# Patient Record
Sex: Female | Born: 1995
Health system: Southern US, Community
[De-identification: ages and names within clinical notes are randomized; demographics above are authoritative.]

## PROBLEM LIST (undated history)

## (undated) DIAGNOSIS — K219 Gastro-esophageal reflux disease without esophagitis: Secondary | ICD-10-CM

---
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
## 2015-05-17 ENCOUNTER — Encounter (HOSPITAL_BASED_OUTPATIENT_CLINIC_OR_DEPARTMENT_OTHER): Payer: Self-pay | Admitting: *Deleted

## 2015-05-17 ENCOUNTER — Emergency Department (HOSPITAL_BASED_OUTPATIENT_CLINIC_OR_DEPARTMENT_OTHER)
Admission: EM | Admit: 2015-05-17 | Discharge: 2015-05-17 | Disposition: A | Discharge status: Home / Self Care | Payer: Self-pay | Attending: Emergency Medicine | Admitting: Emergency Medicine

## 2015-05-17 DIAGNOSIS — Z792 Long term (current) use of antibiotics: Secondary | ICD-10-CM | POA: Insufficient documentation

## 2015-05-17 DIAGNOSIS — Z79899 Other long term (current) drug therapy: Secondary | ICD-10-CM | POA: Insufficient documentation

## 2015-05-17 DIAGNOSIS — Z3202 Encounter for pregnancy test, result negative: Secondary | ICD-10-CM | POA: Insufficient documentation

## 2015-05-17 DIAGNOSIS — A5901 Trichomonal vulvovaginitis: Secondary | ICD-10-CM | POA: Insufficient documentation

## 2015-05-17 DIAGNOSIS — N938 Other specified abnormal uterine and vaginal bleeding: Secondary | ICD-10-CM | POA: Insufficient documentation

## 2015-05-17 LAB — URINE MICROSCOPIC-ADD ON

## 2015-05-17 LAB — CBC WITH DIFFERENTIAL/PLATELET
BASOS PCT: 0 % (ref 0–1)
Basophils Absolute: 0 10*3/uL (ref 0.0–0.1)
Eosinophils Absolute: 0.1 10*3/uL (ref 0.0–0.7)
Eosinophils Relative: 2 % (ref 0–5)
HEMATOCRIT: 42.6 % (ref 36.0–46.0)
HEMOGLOBIN: 13.7 g/dL (ref 12.0–15.0)
LYMPHS ABS: 2 10*3/uL (ref 0.7–4.0)
Lymphocytes Relative: 36 % (ref 12–46)
MCH: 27.9 pg (ref 26.0–34.0)
MCHC: 32.2 g/dL (ref 30.0–36.0)
MCV: 86.8 fL (ref 78.0–100.0)
MONOS PCT: 10 % (ref 3–12)
Monocytes Absolute: 0.5 10*3/uL (ref 0.1–1.0)
NEUTROS ABS: 2.9 10*3/uL (ref 1.7–7.7)
NEUTROS PCT: 52 % (ref 43–77)
Platelets: 265 10*3/uL (ref 150–400)
RBC: 4.91 MIL/uL (ref 3.87–5.11)
RDW: 13.2 % (ref 11.5–15.5)
WBC: 5.6 10*3/uL (ref 4.0–10.5)

## 2015-05-17 LAB — URINALYSIS, ROUTINE W REFLEX MICROSCOPIC
Bilirubin Urine: NEGATIVE
GLUCOSE, UA: NEGATIVE mg/dL
Ketones, ur: NEGATIVE mg/dL
Nitrite: NEGATIVE
PH: 5.5 (ref 5.0–8.0)
Protein, ur: NEGATIVE mg/dL
Specific Gravity, Urine: 1.007 (ref 1.005–1.030)
Urobilinogen, UA: 0.2 mg/dL (ref 0.0–1.0)

## 2015-05-17 LAB — PREGNANCY, URINE: Preg Test, Ur: NEGATIVE

## 2015-05-17 MED ORDER — AZITHROMYCIN 250 MG PO TABS
1000.0000 mg | ORAL_TABLET | Freq: Once | ORAL | Status: AC
  Administered 2015-05-17: 1000 mg via ORAL
  Filled 2015-05-17: qty 4

## 2015-05-17 MED ORDER — CEFTRIAXONE SODIUM 250 MG IJ SOLR
250.0000 mg | Freq: Once | INTRAMUSCULAR | Status: AC
  Administered 2015-05-17: 250 mg via INTRAMUSCULAR
  Filled 2015-05-17: qty 250

## 2015-05-17 MED ORDER — METRONIDAZOLE 500 MG PO TABS: 500.0000 mg | ORAL_TABLET | Freq: Two times a day (BID) | ORAL | Status: DC

## 2015-05-17 NOTE — ED Provider Notes (Signed)
CSN: [id number]     Arrival date & time 05/17/15  1558 History   First MD Initiated Contact with Patient 05/17/15 1625     Chief Complaint  Patient presents with  . Vaginal Bleeding     (Consider location/radiation/quality/duration/timing/severity/associated sxs/prior Treatment) Patient is a 19 y.o. female presenting with vaginal bleeding. The history is provided by the patient.  Vaginal Bleeding Quality:  Lighter than menses Severity:  Moderate Onset quality:  Gradual Duration:  1 month Timing:  Intermittent Progression:  Waxing and waning Chronicity:  New Menstrual history:  Irregular Number of pads used:  1 every 3-4hrs Possible pregnancy: no   Context: spontaneously   Relieved by:  None tried Worsened by:  Nothing tried Ineffective treatments:  None tried Associated symptoms: no abdominal pain, no back pain, no dysuria, no fever, no nausea and no vaginal discharge   Risk factors: no new sexual partner and does not have unprotected sex   Risk factors comment:  Uses condoms regularly and 1 partner   History reviewed. No pertinent past medical history. History reviewed. No pertinent past surgical history. No family history on file. Social History  Substance Use Topics  . Smoking status: Never Smoker   . Smokeless tobacco: None  . Alcohol Use: No   OB History    No data available     Review of Systems  Constitutional: Negative for fever.  Gastrointestinal: Negative for nausea and abdominal pain.  Genitourinary: Positive for vaginal bleeding. Negative for dysuria and vaginal discharge.  Musculoskeletal: Negative for back pain.  All other systems reviewed and are negative.     Allergies  Review of patient's allergies indicates no known allergies.  Home Medications   Prior to Admission medications   Medication Sig Start Date End Date Taking? Authorizing Provider  erythromycin ophthalmic ointment Place a 1/2 inch ribbon of ointment into the lower eyelid three  times daily 06/07/13   Glynn Octave, MD  ketorolac (ACULAR) 0.5 % ophthalmic solution Place 1 drop into the right eye every 6 (six) hours. 06/10/13   Gilda Crease, MD  metroNIDAZOLE (FLAGYL) 500 MG tablet Take 1 tablet (500 mg total) by mouth 2 (two) times daily. 05/17/15   Gwyneth Sprout, MD  omeprazole (PRILOSEC) 20 MG capsule Take 1 capsule (20 mg total) by mouth daily. 09/06/13   Gerhard Munch, MD  sucralfate (CARAFATE) 1 G tablet Take 1 tablet (1 g total) by mouth 4 (four) times daily. 09/06/13 09/13/13  Gerhard Munch, MD   BP 147/87 mmHg  Pulse 107  Temp(Src) 98.3 F (36.8 C) (Oral)  Resp 20  Ht 5\' 9"  (1.753 m)  Wt 220 lb (99.791 kg)  BMI 32.47 kg/m2  SpO2 99% Physical Exam  Constitutional: She is oriented to person, place, and time. She appears well-developed and well-nourished. No distress.  HENT:  Head: Normocephalic and atraumatic.  Mouth/Throat: Oropharynx is clear and moist.  Eyes: Conjunctivae and EOM are normal. Pupils are equal, round, and reactive to light.  Neck: Normal range of motion. Neck supple.  Cardiovascular: Normal rate, regular rhythm and intact distal pulses.   No murmur heard. Pulmonary/Chest: Effort normal and breath sounds normal. No respiratory distress. She has no wheezes. She has no rales.  Abdominal: Soft. She exhibits no distension. There is no tenderness. There is no rebound, no guarding and no CVA tenderness.  Musculoskeletal: Normal range of motion. She exhibits no edema or tenderness.  Neurological: She is alert and oriented to person, place, and time.  Skin: Skin  is warm and dry. No rash noted. No erythema.  Psychiatric: She has a normal mood and affect. Her behavior is normal.  Nursing note and vitals reviewed.   ED Course  Procedures (including critical care time) Labs Review Labs Reviewed  URINALYSIS, ROUTINE W REFLEX MICROSCOPIC (NOT AT [facility]) - Abnormal; Notable for the following:    APPearance CLOUDY (*)    Hgb urine  dipstick LARGE (*)    Leukocytes, UA LARGE (*)    All other components within normal limits  URINE MICROSCOPIC-ADD ON - Abnormal; Notable for the following:    Squamous Epithelial / LPF FEW (*)    Bacteria, UA FEW (*)    All other components within normal limits  WET PREP, GENITAL  PREGNANCY, URINE  CBC WITH DIFFERENTIAL/PLATELET  GC/CHLAMYDIA PROBE AMP ([place]) NOT AT [facility]    Imaging Review No results found. I have personally reviewed and evaluated these images and lab results as part of my medical decision-making.   EKG Interpretation None      MDM   Final diagnoses:  Trichomonas vaginitis  Dysfunctional uterine bleeding   Patient is here complaining of vaginal bleeding over the last month which has been waxing and waning. She'll have several days of more than normal typical menses and then I will be spotting and then recur. She had a shot about one year ago and more recently has started periods regularly. She denies any dysuria, back pain, abdominal pain, vaginal discharge or fever. Her exam is normal however patient refuses pelvic exam. Discussed with her that be on able to evaluate her main complaint which is vaginal bleeding however she still refuses. Patient's urine was found to have Trichomonas and discussed with her that we need to check for gonorrhea and chlamydia but she still refuses a pelvic exam. Patient was treated with azithromycin, Rocephin and Flagyl. She was given follow-up to women's clinic if the bleeding continued. Her CBC is within normal limits and she has no history of weakness, chest pain or shortness of breath.    Gwyneth Sprout, MD 05/17/15 1734

## 2015-05-17 NOTE — Discharge Instructions (Signed)

## 2015-05-17 NOTE — ED Notes (Signed)
Abnormal vaginal bleeding since for the past month.

## 2016-01-23 ENCOUNTER — Encounter (HOSPITAL_BASED_OUTPATIENT_CLINIC_OR_DEPARTMENT_OTHER): Payer: Self-pay | Admitting: Emergency Medicine

## 2016-01-23 ENCOUNTER — Emergency Department (HOSPITAL_BASED_OUTPATIENT_CLINIC_OR_DEPARTMENT_OTHER)
Admission: EM | Admit: 2016-01-23 | Discharge: 2016-01-23 | Disposition: A | Discharge status: Home / Self Care | Payer: No Typology Code available for payment source | Attending: Emergency Medicine | Admitting: Emergency Medicine

## 2016-01-23 DIAGNOSIS — M546 Pain in thoracic spine: Secondary | ICD-10-CM | POA: Diagnosis present

## 2016-01-23 DIAGNOSIS — Y999 Unspecified external cause status: Secondary | ICD-10-CM | POA: Insufficient documentation

## 2016-01-23 DIAGNOSIS — S29012A Strain of muscle and tendon of back wall of thorax, initial encounter: Secondary | ICD-10-CM | POA: Insufficient documentation

## 2016-01-23 DIAGNOSIS — Y92481 Parking lot as the place of occurrence of the external cause: Secondary | ICD-10-CM | POA: Diagnosis not present

## 2016-01-23 DIAGNOSIS — Y939 Activity, unspecified: Secondary | ICD-10-CM | POA: Insufficient documentation

## 2016-01-23 DIAGNOSIS — S29019A Strain of muscle and tendon of unspecified wall of thorax, initial encounter: Secondary | ICD-10-CM

## 2016-01-23 MED ORDER — IBUPROFEN 400 MG PO TABS: 400.0000 mg | ORAL_TABLET | Freq: Three times a day (TID) | ORAL | Status: DC | PRN

## 2016-01-23 MED ORDER — CYCLOBENZAPRINE HCL 10 MG PO TABS: 10.0000 mg | ORAL_TABLET | Freq: Three times a day (TID) | ORAL | Status: DC | PRN
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
## 2016-01-23 NOTE — Discharge Instructions (Signed)
Thoracic Strain A thoracic strain, which is sometimes called a mid-back strain, is an injury to the muscles or tendons that attach to the upper part of your back behind your chest. This type of injury occurs when a muscle is overstretched or overloaded.  Thoracic strains can range from mild to severe. Mild strains may involve stretching a muscle or tendon without tearing it. These injuries may heal in 1-2 weeks. More severe strains involve tearing of muscle fibers or tendons. These will cause more pain and may take 6-8 weeks to heal. CAUSES This condition may be caused by:  An injury in which a sudden force is placed on the muscle.  Exercising without properly warming up.  Overuse of the muscle.  Improper form during certain movements.  Other injuries that surround or cause stress on the mid-back, causing a strain on the muscles. In some cases, the cause may not be known. RISK FACTORS This injury is more common in:  Athletes.  People with obesity. SYMPTOMS The main symptom of this condition is pain, especially with movement. Other symptoms include:  Bruising.  Swelling.  Spasm. DIAGNOSIS This condition may be diagnosed with a physical exam. X-rays may be taken to check for a fracture. TREATMENT This condition may be treated with:  Resting and icing the injured area.  Physical therapy. This will involve doing stretching and strengthening exercises.  Medicines for pain and inflammation. HOME CARE INSTRUCTIONS  Rest as needed. Follow instructions from your health care provider about any restrictions on activity.  If directed, apply ice to the injured area:  Put ice in a plastic bag.  Place a towel between your skin and the bag.  Leave the ice on for 20 minutes, 2-3 times per day.  Take over-the-counter and prescription medicines only as told by your health care provider.  Begin doing exercises as told by your health care provider or physical therapist.  Always  warm up properly before physical activity or sports.  Bend your knees before you lift heavy objects.  Keep all follow-up visits as told by your health care provider. This is important. SEEK MEDICAL CARE IF:  Your pain is not helped by medicine.  Your pain, bruising, or swelling is getting worse.  You have a fever. SEEK IMMEDIATE MEDICAL CARE IF:  You have shortness of breath.  You have chest pain.  You develop numbness or weakness in your legs.  You have involuntary loss of urine (urinary incontinence).   This information is not intended to replace advice given to you by your health care provider. Make sure you discuss any questions you have with your health care provider.   Document Released: 11/10/2003 Document Revised: 05/11/2015 Document Reviewed: 10/14/2014 Elsevier Interactive Patient Education 2016 [employer]  

## 2016-02-02 ENCOUNTER — Encounter (HOSPITAL_BASED_OUTPATIENT_CLINIC_OR_DEPARTMENT_OTHER): Payer: Self-pay | Admitting: Emergency Medicine

## 2016-02-02 ENCOUNTER — Emergency Department (HOSPITAL_BASED_OUTPATIENT_CLINIC_OR_DEPARTMENT_OTHER)
Admission: EM | Admit: 2016-02-02 | Discharge: 2016-02-03 | Disposition: A | Discharge status: Home / Self Care | Payer: No Typology Code available for payment source | Attending: Emergency Medicine | Admitting: Emergency Medicine

## 2016-02-02 DIAGNOSIS — N921 Excessive and frequent menstruation with irregular cycle: Secondary | ICD-10-CM

## 2016-02-02 DIAGNOSIS — N920 Excessive and frequent menstruation with regular cycle: Secondary | ICD-10-CM | POA: Insufficient documentation

## 2016-02-02 LAB — CBC WITH DIFFERENTIAL/PLATELET
Basophils Absolute: 0 10*3/uL (ref 0.0–0.1)
Basophils Relative: 0 %
Eosinophils Absolute: 0.1 10*3/uL (ref 0.0–0.7)
Eosinophils Relative: 3 %
HCT: 39.5 % (ref 36.0–46.0)
Hemoglobin: 12.9 g/dL (ref 12.0–15.0)
LYMPHS ABS: 2.1 10*3/uL (ref 0.7–4.0)
Lymphocytes Relative: 38 %
MCH: 28.2 pg (ref 26.0–34.0)
MCHC: 32.7 g/dL (ref 30.0–36.0)
MCV: 86.4 fL (ref 78.0–100.0)
MONO ABS: 0.4 10*3/uL (ref 0.1–1.0)
MONOS PCT: 7 %
NEUTROS ABS: 2.8 10*3/uL (ref 1.7–7.7)
Neutrophils Relative %: 52 %
PLATELETS: 276 10*3/uL (ref 150–400)
RBC: 4.57 MIL/uL (ref 3.87–5.11)
RDW: 13.1 % (ref 11.5–15.5)
WBC: 5.4 10*3/uL (ref 4.0–10.5)

## 2016-02-02 LAB — BASIC METABOLIC PANEL
ANION GAP: 5 (ref 5–15)
BUN: 7 mg/dL (ref 6–20)
CO2: 26 mmol/L (ref 22–32)
Calcium: 9.4 mg/dL (ref 8.9–10.3)
Chloride: 107 mmol/L (ref 101–111)
Creatinine, Ser: 0.83 mg/dL (ref 0.44–1.00)
GFR calc Af Amer: >60 mL/min (ref >60)
Glucose, Bld: 117 mg/dL — ABNORMAL HIGH (ref 65–99)
Potassium: 3.8 mmol/L (ref 3.5–5.1)
SODIUM: 138 mmol/L (ref 135–145)

## 2016-02-02 LAB — URINALYSIS, ROUTINE W REFLEX MICROSCOPIC
BILIRUBIN URINE: NEGATIVE
GLUCOSE, UA: NEGATIVE mg/dL
KETONES UR: NEGATIVE mg/dL
Nitrite: NEGATIVE
Protein, ur: NEGATIVE mg/dL
Specific Gravity, Urine: 1.02 (ref 1.005–1.030)
pH: 5.5 (ref 5.0–8.0)

## 2016-02-02 LAB — URINE MICROSCOPIC-ADD ON

## 2016-02-02 LAB — PREGNANCY, URINE: PREG TEST UR: NEGATIVE

## 2016-02-02 NOTE — ED Notes (Signed)
Vag bleeding x 2 months w some clots  Denies urinary sx,  Denies pain

## 2016-02-02 NOTE — ED Provider Notes (Signed)
CSN: [id number]     Arrival date & time 02/02/16  2145 History  By signing my name below, I, Alison Hunter, attest that this documentation has been prepared under the direction and in the presence of Richardean Canal, MD . Electronically Signed: Majel Hunter, Scribe. 02/02/2016. 11:15 PM.   Chief Complaint  Patient presents with  . Vaginal Bleeding   The history is provided by the patient. No language interpreter was used.   HPI Comments:  Alison Hunter is a 20 y.o. female who presents to the Emergency Department complaining of gradually worsening, unchanged, vaginal bleeding from her menstrual cycle lasting three months. Pt states that she uses ~4-5 pads or tampons every day. Pt states that she was on birth control pills but stopped taking it in April due to abnormal amounts of bleeding during menstrual cycle. She notes that there is chance that she might be pregnant. Pt denies abdominal pain, light-headedness, dizziness, chest pain, or dysuria.   History reviewed. No pertinent past medical history. History reviewed. No pertinent past surgical history. History reviewed. No pertinent family history. Social History  Substance Use Topics  . Smoking status: Never Smoker   . Smokeless tobacco: None  . Alcohol Use: No   OB History    No data available     Review of Systems  Cardiovascular: Negative for chest pain.  Gastrointestinal: Negative for abdominal pain.  Genitourinary: Positive for vaginal bleeding. Negative for dysuria and vaginal pain.  Neurological: Negative for dizziness and light-headedness.  All other systems reviewed and are negative.     Allergies  Review of patient's allergies indicates no known allergies.  Home Medications   Prior to Admission medications   Medication Sig Start Date End Date Taking? Authorizing Provider  cyclobenzaprine (FLEXERIL) 10 MG tablet Take 1 tablet (10 mg total) by mouth 3 (three) times daily as needed for muscle spasms. 01/23/16   Azalia Bilis,  MD  ibuprofen (ADVIL,MOTRIN) 400 MG tablet Take 1 tablet (400 mg total) by mouth every 8 (eight) hours as needed. 01/23/16   Azalia Bilis, MD  omeprazole (PRILOSEC) 20 MG capsule Take 1 capsule (20 mg total) by mouth daily. 09/06/13   Gerhard Munch, MD  sucralfate (CARAFATE) 1 G tablet Take 1 tablet (1 g total) by mouth 4 (four) times daily. 09/06/13 09/13/13  Gerhard Munch, MD   Triage vitals: BP 143/86 mmHg  Pulse 89  Temp(Src) 97.9 F (36.6 C) (Oral)  Resp 18  Ht  (1.753 m)  Wt 230 lb (104.327 kg)  BMI 33.95 kg/m2  SpO2 100%  LMP 01/23/2016 Physical Exam  Constitutional: She is oriented to person, place, and time. She appears well-developed and well-nourished.  HENT:  Head: Normocephalic.  Eyes: EOM are normal.  Neck: Normal range of motion.  Cardiovascular: Normal rate and regular rhythm.   Pulmonary/Chest: Effort normal and breath sounds normal.  Abdominal: She exhibits no distension. There is no tenderness.  No abdominal tenderness   Genitourinary:  Deferred   Musculoskeletal: Normal range of motion.  Neurological: She is alert and oriented to person, place, and time.  Psychiatric: She has a normal mood and affect.  Nursing note and vitals reviewed.   ED Course  Procedures  DIAGNOSTIC STUDIES:  Oxygen Saturation is 100% on RA, normal by my interpretation.    COORDINATION OF CARE:  11:05 PM Discussed treatment plan, which includes urine and blood testing with pt at bedside and pt agreed to plan. Pt has been advised to follow up with  OB about alternative birth control options.   Labs Review Labs Reviewed  BASIC METABOLIC PANEL - Abnormal; Notable for the following:    Glucose, Bld 117 (*)    All other components within normal limits  URINALYSIS, ROUTINE W REFLEX MICROSCOPIC (NOT AT [facility]) - Abnormal; Notable for the following:    APPearance CLOUDY (*)    Hgb urine dipstick LARGE (*)    Leukocytes, UA SMALL (*)    All other components within normal limits   URINE MICROSCOPIC-ADD ON - Abnormal; Notable for the following:    Squamous Epithelial / LPF 0-5 (*)    Bacteria, UA FEW (*)    All other components within normal limits  CBC WITH DIFFERENTIAL/PLATELET  PREGNANCY, URINE    Imaging Review No results found. I have personally reviewed and evaluated these images and lab results as part of my medical decision-making.   EKG Interpretation None      MDM   Final diagnoses:  None   Alison Hunter is a 20 y.o. female here with vaginal bleeding for 2 months. Vitals stable. Denies light headedness or dizziness or chest pain. Well appearing. Hg stable. No vaginal bleeding. She recently stopped OCP and I told that she should start back on it.   I personally performed the services described in this documentation, which was scribed in my presence. The recorded information has been reviewed and is accurate.   Richardean Canalavid H Kendal Raffo, MD 02/03/16 Burna [id number]

## 2016-02-02 NOTE — ED Notes (Signed)
Patient states that she has had her period x 2 months

## 2016-02-03 NOTE — Discharge Instructions (Signed)
You should refill your oral contraceptive.   See GYN for different contraceptive options.   Return to ER if you have dizziness, passing out, light headedness, worse bleeding, severe pelvic pain.

## 2016-07-04 ENCOUNTER — Encounter: Payer: Self-pay | Admitting: Obstetrics & Gynecology

## 2016-08-21 ENCOUNTER — Encounter: Payer: Self-pay | Admitting: Obstetrics & Gynecology

## 2016-09-25 ENCOUNTER — Emergency Department (HOSPITAL_BASED_OUTPATIENT_CLINIC_OR_DEPARTMENT_OTHER)
Admission: EM | Admit: 2016-09-25 | Discharge: 2016-09-25 | Discharge status: Absent without leave | Payer: No Typology Code available for payment source

## 2016-10-03 ENCOUNTER — Encounter (HOSPITAL_COMMUNITY): Payer: Self-pay | Admitting: *Deleted

## 2016-10-03 ENCOUNTER — Inpatient Hospital Stay (HOSPITAL_COMMUNITY)
Admission: AD | Admit: 2016-10-03 | Discharge: 2016-10-04 | Disposition: A | Discharge status: Home / Self Care | Payer: Self-pay | Source: Ambulatory Visit | Attending: Obstetrics and Gynecology | Admitting: Obstetrics and Gynecology

## 2016-10-03 DIAGNOSIS — N939 Abnormal uterine and vaginal bleeding, unspecified: Secondary | ICD-10-CM | POA: Insufficient documentation

## 2016-10-03 DIAGNOSIS — Z3202 Encounter for pregnancy test, result negative: Secondary | ICD-10-CM | POA: Insufficient documentation

## 2016-10-03 LAB — URINALYSIS, ROUTINE W REFLEX MICROSCOPIC
Bilirubin Urine: NEGATIVE
Glucose, UA: NEGATIVE mg/dL
Ketones, ur: NEGATIVE mg/dL
Nitrite: NEGATIVE
PROTEIN: NEGATIVE mg/dL
Specific Gravity, Urine: 1.023 (ref 1.005–1.030)
pH: 6 (ref 5.0–8.0)

## 2016-10-03 LAB — POCT PREGNANCY, URINE: PREG TEST UR: NEGATIVE

## 2016-10-03 NOTE — MAU Note (Signed)
Pt reports vaginal bleeding for 3 months, states it is heavy some days with clots and then it will be spotting.

## 2016-10-03 NOTE — MAU Note (Signed)
Pt reports vaginal bleeding x3 months, some days heavier that others. Bleeding was heavy this morning but is light now. Denies pain. Pt is not on any type of birth control. States she has a hx of irregular periods.

## 2016-10-04 DIAGNOSIS — N939 Abnormal uterine and vaginal bleeding, unspecified: Secondary | ICD-10-CM

## 2016-10-04 LAB — WET PREP, GENITAL
SPERM: NONE SEEN
Trich, Wet Prep: NONE SEEN
YEAST WET PREP: NONE SEEN

## 2016-10-04 LAB — CBC
HCT: 35.1 % — ABNORMAL LOW (ref 36.0–46.0)
HEMOGLOBIN: 11.4 g/dL — AB (ref 12.0–15.0)
MCH: 26.3 pg (ref 26.0–34.0)
MCHC: 32.5 g/dL (ref 30.0–36.0)
MCV: 81.1 fL (ref 78.0–100.0)
Platelets: 311 10*3/uL (ref 150–400)
RBC: 4.33 MIL/uL (ref 3.87–5.11)
RDW: 14.3 % (ref 11.5–15.5)
WBC: 5.6 10*3/uL (ref 4.0–10.5)

## 2016-10-04 MED ORDER — METRONIDAZOLE 500 MG PO TABS: 500.0000 mg | ORAL_TABLET | Freq: Two times a day (BID) | ORAL | 0 refills | Status: DC

## 2016-10-04 NOTE — Discharge Instructions (Signed)
Bacterial Vaginosis Bacterial vaginosis is a vaginal infection that occurs when the normal balance of bacteria in the vagina is disrupted. It results from an overgrowth of certain bacteria. This is the most common vaginal infection among women ages 55-44. Because bacterial vaginosis increases your risk for STIs (sexually transmitted infections), getting treated can help reduce your risk for chlamydia, gonorrhea, herpes, and HIV (human immunodeficiency virus). Treatment is also important for preventing complications in pregnant women, because this condition can cause an early (premature) delivery. What are the causes? This condition is caused by an increase in harmful bacteria that are normally present in small amounts in the vagina. However, the reason that the condition develops is not fully understood. What increases the risk? The following factors may make you more likely to develop this condition:  Having a new sexual partner or multiple sexual partners.  Having unprotected sex.  Douching.  Having an intrauterine device (IUD).  Smoking.  Drug and alcohol abuse.  Taking certain antibiotic medicines.  Being pregnant. You cannot get bacterial vaginosis from toilet seats, bedding, swimming pools, or contact with objects around you. What are the signs or symptoms? Symptoms of this condition include:  Grey or white vaginal discharge. The discharge can also be watery or foamy.  A fish-like odor with discharge, especially after sexual intercourse or during menstruation.  Itching in and around the vagina.  Burning or pain with urination. Some women with bacterial vaginosis have no signs or symptoms. How is this diagnosed? This condition is diagnosed based on:  Your medical history.  A physical exam of the vagina.  Testing a sample of vaginal fluid under a microscope to look for a large amount of bad bacteria or abnormal cells. Your health care provider may use a cotton swab or a  small wooden spatula to collect the sample. How is this treated? This condition is treated with antibiotics. These may be given as a pill, a vaginal cream, or a medicine that is put into the vagina (suppository). If the condition comes back after treatment, a second round of antibiotics may be needed. Follow these instructions at home: Medicines  Take over-the-counter and prescription medicines only as told by your health care provider.  Take or use your antibiotic as told by your health care provider. Do not stop taking or using the antibiotic even if you start to feel better. General instructions  If you have a female sexual partner, tell her that you have a vaginal infection. She should see her health care provider and be treated if she has symptoms. If you have a female sexual partner, he does not need treatment.  During treatment:  Avoid sexual activity until you finish treatment.  Do not douche.  Avoid alcohol as directed by your health care provider.  Avoid breastfeeding as directed by your health care provider.  Drink enough water and fluids to keep your urine clear or pale yellow.  Keep the area around your vagina and rectum clean.  Wash the area daily with warm water.  Wipe yourself from front to back after using the toilet.  Keep all follow-up visits as told by your health care provider. This is important. How is this prevented?  Do not douche.  Wash the outside of your vagina with warm water only.  Use protection when having sex. This includes latex condoms and dental dams.  Limit how many sexual partners you have. To help prevent bacterial vaginosis, it is best to have sex with just one partner (monogamous).  Make sure you and your sexual partner are tested for STIs.  Wear cotton or cotton-lined underwear.  Avoid wearing tight pants and pantyhose, especially during summer.  Limit the amount of alcohol that you drink.  Do not use any products that contain  nicotine or tobacco, such as cigarettes and e-cigarettes. If you need help quitting, ask your health care provider.  Do not use illegal drugs. Where to find more information:  Centers for Disease Control and Prevention: SolutionApps.co.zawww.cdc.gov/std  American Sexual Health Association (ASHA): www.ashastd.org  U.S. Department of Health and Health and safety inspectorHuman Services, Office on Women's Health: ConventionalMedicines.siwww.womenshealth.gov/ or http://www.anderson-williamson.info/https://www.womenshealth.gov/a-z-topics/bacterial-vaginosis Contact a health care provider if:  Your symptoms do not improve, even after treatment.  You have more discharge or pain when urinating.  You have a fever.  You have pain in your abdomen.  You have pain during sex.  You have vaginal bleeding between periods. Summary  Bacterial vaginosis is a vaginal infection that occurs when the normal balance of bacteria in the vagina is disrupted.  Because bacterial vaginosis increases your risk for STIs (sexually transmitted infections), getting treated can help reduce your risk for chlamydia, gonorrhea, herpes, and HIV (human immunodeficiency virus). Treatment is also important for preventing complications in pregnant women, because the condition can cause an early (premature) delivery.  This condition is treated with antibiotic medicines. These may be given as a pill, a vaginal cream, or a medicine that is put into the vagina (suppository). This information is not intended to replace advice given to you by your health care provider. Make sure you discuss any questions you have with your health care provider. Document Released: 08/20/2005 Document Revised: 05/05/2016 Document Reviewed: 05/05/2016 Elsevier Interactive Patient Education  2017 [employer]vier Inc.   Abnormal Uterine Bleeding Abnormal uterine bleeding can affect women at various stages in life, including teenagers, women in their reproductive years, pregnant women, and women who have reached menopause. Several kinds of uterine bleeding are  considered abnormal, including:  Bleeding or spotting between periods.  Bleeding after sexual intercourse.  Bleeding that is heavier or more than normal.  Periods that last longer than usual.  Bleeding after menopause. Many cases of abnormal uterine bleeding are minor and simple to treat, while others are more serious. Any type of abnormal bleeding should be evaluated by your health care provider. Treatment will depend on the cause of the bleeding. Follow these instructions at home: Monitor your condition for any changes. The following actions may help to alleviate any discomfort you are experiencing:  Avoid the use of tampons and douches as directed by your health care provider.  Change your pads frequently. You should get regular pelvic exams and Pap tests. Keep all follow-up appointments for diagnostic tests as directed by your health care provider. Contact a health care provider if:  Your bleeding lasts more than 1 week.  You feel dizzy at times. Get help right away if:  You pass out.  You are changing pads every 15 to 30 minutes.  You have abdominal pain.  You have a fever.  You become sweaty or weak.  You are passing large blood clots from the vagina.  You start to feel nauseous and vomit. This information is not intended to replace advice given to you by your health care provider. Make sure you discuss any questions you have with your health care provider. Document Released: 08/20/2005 Document Revised: 02/01/2016 Document Reviewed: 03/19/2013 Elsevier Interactive Patient Education  2017 [employer]   Preparing for Pregnancy If you are considering  becoming pregnant, make an appointment to see your regular health care provider to learn how to prepare for a safe and healthy pregnancy (preconception care). During a preconception care visit, your health care provider will:  Do a complete physical exam, including a Pap test.  Take a complete medical  history.  Give you information, answer your questions, and help you resolve problems. Preconception checklist Medical history  Tell your health care provider about any current or past medical conditions. Your pregnancy or your ability to become pregnant may be affected by chronic conditions, such as diabetes, chronic hypertension, and thyroid problems.  Include your family's medical history as well as your partner's medical history.  Tell your health care provider about any history of STIs (sexually transmitted infections).These can affect your pregnancy. In some cases, they can be passed to your baby. Discuss any concerns that you have about STIs.  If indicated, discuss the benefits of genetic testing. This testing will show whether there are any genetic conditions that may be passed from you or your partner to your baby.  Tell your health care provider about:  Any problems you have had with conception or pregnancy.  Any medicines you take. These include vitamins, herbal supplements, and over-the-counter medicines.  Your history of immunizations. Discuss any vaccinations that you may need. Diet  Ask your health care provider what to include in a healthy diet that has a balance of nutrients. This is especially important when you are pregnant or preparing to become pregnant.  Ask your health care provider to help you reach a healthy weight before pregnancy.  If you are overweight, you may be at higher risk for certain complications, such as high blood pressure, diabetes, and preterm birth.  If you are underweight, you are more likely to have a baby who has a low birth weight. Lifestyle, work, and home  Let your health care provider know:  About any lifestyle habits that you have, such as alcohol use, drug use, or smoking.  About recreational activities that may put you at risk during pregnancy, such as downhill skiing and certain exercise programs.  Tell your health care  provider about any international travel, especially any travel to places with an active [place] virus outbreak.  About harmful substances that you may be exposed to at work or at home. These include chemicals, pesticides, radiation, or even litter boxes.  If you do not feel safe at home. Mental health  Tell your health care provider about:  Any history of mental health conditions, including feelings of depression, sadness, or anxiety.  Any medicines that you take for a mental health condition. These include herbs and supplements. Home instructions to prepare for pregnancy  Lifestyle  Eat a balanced diet. This includes fresh fruits and vegetables, whole grains, lean meats, low-fat dairy products, healthy fats, and foods that are high in fiber. Ask to meet with a nutritionist or registered dietitian for assistance with meal planning and goals.  Get regular exercise. Try to be active for at least 30 minutes a day on most days of the week. Ask your health care provider which activities are safe during pregnancy.  Do not use any products that contain nicotine or tobacco, such as cigarettes and e-cigarettes. If you need help quitting, ask your health care provider.  Do not drink alcohol.  Do not take illegal drugs.  Maintain a healthy weight. Ask your health care provider what weight range is right for you. General instructions  Keep an accurate record  of your menstrual periods. This makes it easier for your health care provider to determine your baby's due date.  Begin taking prenatal vitamins and folic acid supplements daily as directed by your health care provider.  Manage any chronic conditions, such as high blood pressure and diabetes, as told by your health care provider. This is important. How do I know that I am pregnant? You may be pregnant if you have been sexually active and you miss your period. Symptoms of early pregnancy include:  Mild cramping.  Very light vaginal bleeding  (spotting).  Feeling unusually tired.  Nausea and vomiting (morning sickness). If you have any of these symptoms and you suspect that you might be pregnant, you can take a home pregnancy test. These tests check for a hormone in your urine (human chorionic gonadotropin, or hCG). A woman's body begins to make this hormone during early pregnancy. These tests are very accurate. Wait until at least the first day after you miss your period to take one. If the test shows that you are pregnant (you get a positive result), call your health care provider to make an appointment for prenatal care. What should I do if I become pregnant?  Make an appointment with your health care provider as soon as you suspect you are pregnant.  Do not use any products that contain nicotine, such as cigarettes, chewing tobacco, and e-cigarettes. If you need help quitting, ask your health care provider.  Do not drink alcoholic beverages. Alcohol is related to a number of birth defects.  Avoid toxic odors and chemicals.  You may continue to have sexual intercourse if it does not cause pain or other problems, such as vaginal bleeding. This information is not intended to replace advice given to you by your health care provider. Make sure you discuss any questions you have with your health care provider. Document Released: 08/02/2008 Document Revised: 04/17/2016 Document Reviewed: 03/11/2016 Elsevier Interactive Patient Education  2017 [employer].

## 2016-10-04 NOTE — MAU Provider Note (Signed)
Chief Complaint: No chief complaint on file.   First Provider Initiated Contact with Patient 10/04/16 0031     SUBJECTIVE HPI: Alison Hunter is a 21 y.o. female who presents to [facility] reporting vaginal bleeding on and off for three months. Pt is TTC. Recently went off birth control. Denies abd pain, dizziness.   History reviewed. No pertinent past medical history. OB History  No data available   History reviewed. No pertinent surgical history. Social History   Social History  . Marital status: Single    Spouse name: N/A  . Number of children: N/A  . Years of education: N/A   Occupational History  . Not on file.   Social History Main Topics  . Smoking status: Never Smoker  . Smokeless tobacco: Never Used  . Alcohol use No  . Drug use: No  . Sexual activity: Yes    Birth control/ protection: None   Other Topics Concern  . Not on file   Social History Narrative  . No narrative on file   History reviewed. No pertinent family history. No current facility-administered medications on file prior to encounter.    Current Outpatient Prescriptions on File Prior to Encounter  Medication Sig Dispense Refill  . cyclobenzaprine (FLEXERIL) 10 MG tablet Take 1 tablet (10 mg total) by mouth 3 (three) times daily as needed for muscle spasms. 12 tablet 0  . ibuprofen (ADVIL,MOTRIN) 400 MG tablet Take 1 tablet (400 mg total) by mouth every 8 (eight) hours as needed. 15 tablet 0  . omeprazole (PRILOSEC) 20 MG capsule Take 1 capsule (20 mg total) by mouth daily. 15 capsule 0  . sucralfate (CARAFATE) 1 G tablet Take 1 tablet (1 g total) by mouth 4 (four) times daily. 28 tablet 0   No Known Allergies  I have reviewed patient's Past Medical Hx, Surgical Hx, Family Hx, Social Hx, medications and allergies.   Review of Systems  Constitutional: Negative for chills, fatigue and fever.  Gastrointestinal: Negative for abdominal pain.  Genitourinary: Positive for vaginal bleeding.  Negative for hematuria and vaginal discharge.  Neurological: Negative for dizziness and weakness.    OBJECTIVE Patient Vitals for the past 24 hrs:  BP Temp Temp src Pulse Resp SpO2 Height Weight  10/03/16 2321 147/83 98.3 F (36.8 C) Oral 90 17 100 % 5\' 9"  (1.753 m) 237 lb (107.5 kg)   Constitutional: Well-developed, well-nourished female in no acute distress.  Skin: no pallor.  Cardiovascular: normal rate Respiratory: normal rate and effort.  Neurologic: Alert and oriented x 4.  GU: SPECULUM EXAM: Refused. Blind swabs collected.   LAB RESULTS Results for orders placed or performed during the hospital encounter of 10/03/16 (from the past 24 hour(s))  Urinalysis, Routine w reflex microscopic     Status: Abnormal   Collection Time: 10/03/16 11:24 PM  Result Value Ref Range   Color, Urine YELLOW YELLOW   APPearance HAZY (A) CLEAR   Specific Gravity, Urine 1.023 1.005 - 1.030   pH 6.0 5.0 - 8.0   Glucose, UA NEGATIVE NEGATIVE mg/dL   Hgb urine dipstick LARGE (A) NEGATIVE   Bilirubin Urine NEGATIVE NEGATIVE   Ketones, ur NEGATIVE NEGATIVE mg/dL   Protein, ur NEGATIVE NEGATIVE mg/dL   Nitrite NEGATIVE NEGATIVE   Leukocytes, UA TRACE (A) NEGATIVE   RBC / HPF 0-5 0 - 5 RBC/hpf   WBC, UA 6-30 0 - 5 WBC/hpf   Bacteria, UA RARE (A) NONE SEEN   Squamous Epithelial / LPF 6-30 (A) NONE  SEEN   Mucous PRESENT   Pregnancy, urine POC     Status: None   Collection Time: 10/03/16 11:43 PM  Result Value Ref Range   Preg Test, Ur NEGATIVE NEGATIVE  Wet prep, genital     Status: Abnormal   Collection Time: 10/04/16 12:39 AM  Result Value Ref Range   Yeast Wet Prep HPF POC NONE SEEN NONE SEEN   Trich, Wet Prep NONE SEEN NONE SEEN   Clue Cells Wet Prep HPF POC PRESENT (A) NONE SEEN   WBC, Wet Prep HPF POC MODERATE (A) NONE SEEN   Sperm NONE SEEN   CBC     Status: Abnormal   Collection Time: 10/04/16 12:47 AM  Result Value Ref Range   WBC 5.6 4.0 - 10.5 K/uL   RBC 4.33 3.87 - 5.11 MIL/uL    Hemoglobin 11.4 (L) 12.0 - 15.0 g/dL   HCT 16.1 (L) 09.6 - 04.5 %   MCV 81.1 78.0 - 100.0 fL   MCH 26.3 26.0 - 34.0 pg   MCHC 32.5 30.0 - 36.0 g/dL   RDW 40.9 81.1 - 91.4 %   Platelets 311 150 - 400 K/uL    IMAGING No results found.  MAU COURSE Orders Placed This Encounter  Procedures  . Wet prep, genital  . Urinalysis, Routine w reflex microscopic  . CBC  . Pregnancy, urine POC  . Discharge patient   Meds ordered this encounter  Medications  . metroNIDAZOLE (FLAGYL) 500 MG tablet    Sig: Take 1 tablet (500 mg total) by mouth 2 (two) times daily.    Dispense:  14 tablet    Refill:  0    Order Specific Question:   Supervising Provider    Answer:   Tilda Burrow [[id number]]    MDM - AUB w/ stable Hgb and VS. Suspect PCOS/anovulatory bleeding. Does not want hormone therapy to Tx bleeding sinc eshe is TTC.  - BV. Tx w/ Flagyl  ASSESSMENT 1. Abnormal uterine bleeding (AUB)     PLAN Discharge home in stable condition. GC/Chlamydia pending Bleeding precautions List of Gynecologists given.  Follow-up Information    Gynecologist Follow up.   Why:  for management of abnormal uterine bleeding       [facility] [place] [facility] ADMISS[facility] Follow up.   Why:  as needed in emergencies Contact information: [address] [id number] [address] [place] [address] [phone]         Allergies as of 10/04/2016   No Known Allergies     Medication List    STOP taking these medications   cyclobenzaprine 10 MG tablet Commonly known as:  FLEXERIL   ibuprofen 400 MG tablet Commonly known as:  ADVIL,MOTRIN   omeprazole 20 MG capsule Commonly known as:  PRILOSEC   sucralfate 1 g tablet Commonly known as:  CARAFATE     TAKE these medications   metroNIDAZOLE 500 MG tablet Commonly known as:  FLAGYL Take 1 tablet (500 mg total) by mouth 2 (two) times daily.        [place], CNM 10/04/2016  1:02 AM

## 2016-10-05 LAB — GC/CHLAMYDIA PROBE AMP (~~LOC~~) NOT AT ARMC
Chlamydia: NEGATIVE
Neisseria Gonorrhea: NEGATIVE

## 2016-10-15 ENCOUNTER — Encounter (HOSPITAL_BASED_OUTPATIENT_CLINIC_OR_DEPARTMENT_OTHER): Payer: Self-pay | Admitting: *Deleted

## 2016-10-15 DIAGNOSIS — N938 Other specified abnormal uterine and vaginal bleeding: Secondary | ICD-10-CM | POA: Insufficient documentation

## 2016-10-15 DIAGNOSIS — D649 Anemia, unspecified: Secondary | ICD-10-CM | POA: Insufficient documentation

## 2016-10-15 LAB — URINALYSIS, ROUTINE W REFLEX MICROSCOPIC
BILIRUBIN URINE: NEGATIVE
Glucose, UA: NEGATIVE mg/dL
KETONES UR: NEGATIVE mg/dL
NITRITE: NEGATIVE
Protein, ur: NEGATIVE mg/dL
Specific Gravity, Urine: 1.012 (ref 1.005–1.030)
pH: 6 (ref 5.0–8.0)

## 2016-10-15 LAB — URINALYSIS, MICROSCOPIC (REFLEX)

## 2016-10-15 LAB — PREGNANCY, URINE: PREG TEST UR: NEGATIVE

## 2016-10-15 NOTE — ED Triage Notes (Signed)
Abnormal vaginal bleeding for the past 6 months. [facility] and they treated her for bacterial vaginosis.

## 2016-10-16 ENCOUNTER — Emergency Department (HOSPITAL_BASED_OUTPATIENT_CLINIC_OR_DEPARTMENT_OTHER)
Admission: EM | Admit: 2016-10-16 | Discharge: 2016-10-16 | Disposition: A | Discharge status: Home / Self Care | Payer: Self-pay | Attending: Emergency Medicine | Admitting: Emergency Medicine

## 2016-10-16 DIAGNOSIS — D500 Iron deficiency anemia secondary to blood loss (chronic): Secondary | ICD-10-CM

## 2016-10-16 DIAGNOSIS — N938 Other specified abnormal uterine and vaginal bleeding: Secondary | ICD-10-CM

## 2016-10-16 LAB — CBC WITH DIFFERENTIAL/PLATELET
BASOS ABS: 0 10*3/uL (ref 0.0–0.1)
BASOS PCT: 0 %
Eosinophils Absolute: 0.1 10*3/uL (ref 0.0–0.7)
Eosinophils Relative: 2 %
HEMATOCRIT: 34 % — AB (ref 36.0–46.0)
Hemoglobin: 10.7 g/dL — ABNORMAL LOW (ref 12.0–15.0)
LYMPHS PCT: 51 %
Lymphs Abs: 3.1 10*3/uL (ref 0.7–4.0)
MCH: 26 pg (ref 26.0–34.0)
MCHC: 31.5 g/dL (ref 30.0–36.0)
MCV: 82.7 fL (ref 78.0–100.0)
Monocytes Absolute: 0.6 10*3/uL (ref 0.1–1.0)
Monocytes Relative: 10 %
NEUTROS ABS: 2.2 10*3/uL (ref 1.7–7.7)
NEUTROS PCT: 37 %
Platelets: 259 10*3/uL (ref 150–400)
RBC: 4.11 MIL/uL (ref 3.87–5.11)
RDW: 14.2 % (ref 11.5–15.5)
WBC: 6.1 10*3/uL (ref 4.0–10.5)

## 2016-10-16 MED ORDER — MEDROXYPROGESTERONE ACETATE 10 MG PO TABS: 10.0000 mg | ORAL_TABLET | Freq: Every day | ORAL | 0 refills | Status: DC

## 2016-10-16 MED ORDER — FERROUS SULFATE 325 (65 FE) MG PO TABS: 325.0000 mg | ORAL_TABLET | Freq: Two times a day (BID) | ORAL | 0 refills | Status: AC

## 2016-10-16 NOTE — ED Notes (Signed)
Patient refused pelvic exam. 

## 2016-10-16 NOTE — ED Provider Notes (Signed)
[facility] Provider Note   CSN: [id number] Arrival date & time: 10/15/16  2242  By signing my name below, I, Rosario Adie, attest that this documentation has been prepared under the direction and in the presence of Dione Booze, MD. Electronically Signed: Rosario Adie, ED Scribe. 10/16/16. 12:37 AM.  History   Chief Complaint Chief Complaint  Patient presents with  . Vaginal Bleeding   The history is provided by the patient. No language interpreter was used.    HPI Comments: Alison Hunter is a 21 y.o. female with no pertinent PMHx, who presents to the Emergency Department complaining of intermittent episodes of abnormal vaginal bleeding beginning approximately five months ago. Per family member, she has been using approximately 4-5 pads on average each day to control this. She is passing clot material and notes associated dysmenorrhea. Per prior chart review, pt has been seen multiple times in the ED for this and was recently seen at [facility] for this issue on 10/03/16 (~2 weeks ago). At that time she was dx'd with bacterial vaginosis. They also suspected that her symptoms may be related to PCOS. She was prescribe Flagyl at that time which she took to completion without relief of her bleeding. Pt is not currently on BCP. She is not currently followed by a PCP. Pt denies fever, or any other associated symptoms.   History reviewed. No pertinent past medical history.  There are no active problems to display for this patient.  History reviewed. No pertinent surgical history.  OB History    No data available     Home Medications    Prior to Admission medications   Medication Sig Start Date End Date Taking? Authorizing Provider  metroNIDAZOLE (FLAGYL) 500 MG tablet Take 1 tablet (500 mg total) by mouth 2 (two) times daily. 10/04/16   Dorathy Kinsman, CNM   Family History No family history on file.  Social History Social History  Substance Use  Topics  . Smoking status: Never Smoker  . Smokeless tobacco: Never Used  . Alcohol use No   Allergies   Patient has no known allergies.  Review of Systems Review of Systems  Constitutional: Negative for fever.  Genitourinary: Positive for menstrual problem and vaginal bleeding.  All other systems reviewed and are negative.  Physical Exam Updated Vital Signs BP 120/81   Pulse 90   Temp 98.1 F (36.7 C) (Oral)   Resp 20   Ht 5\' 9"  (1.753 m)   Wt 237 lb (107.5 kg)   SpO2 100%   BMI 35.00 kg/m   Physical Exam  Constitutional: She is oriented to person, place, and time. She appears well-developed and well-nourished.  HENT:  Head: Normocephalic and atraumatic.  Eyes: EOM are normal. Pupils are equal, round, and reactive to light.  Neck: Normal range of motion. Neck supple. No JVD present.  Cardiovascular: Normal rate, regular rhythm and normal heart sounds.   No murmur heard. Pulmonary/Chest: Effort normal and breath sounds normal. She has no wheezes. She has no rales. She exhibits no tenderness.  Abdominal: Soft. Bowel sounds are normal. She exhibits no distension and no mass. There is no tenderness.  Musculoskeletal: Normal range of motion. She exhibits no edema.  Lymphadenopathy:    She has no cervical adenopathy.  Neurological: She is alert and oriented to person, place, and time. No cranial nerve deficit. She exhibits normal muscle tone. Coordination normal.  Skin: Skin is warm and dry. No rash noted.  Psychiatric: She has a  normal mood and affect. Her behavior is normal. Judgment and thought content normal.  Nursing note and vitals reviewed.  ED Treatments / Results  DIAGNOSTIC STUDIES: Oxygen Saturation is 100% on RA, normal by my interpretation.   COORDINATION OF CARE: 12:37 AM-Discussed next steps with pt including CBC. She denies receiving a pelvic exam at this time. Pt verbalized understanding and is agreeable with the plan.   Labs (all labs ordered are  listed, but only abnormal results are displayed) Labs Reviewed  URINALYSIS, ROUTINE W REFLEX MICROSCOPIC - Abnormal; Notable for the following:       Result Value   Color, Urine RED (*)    APPearance CLOUDY (*)    Hgb urine dipstick LARGE (*)    Leukocytes, UA SMALL (*)    All other components within normal limits  URINALYSIS, MICROSCOPIC (REFLEX) - Abnormal; Notable for the following:    Bacteria, UA RARE (*)    Squamous Epithelial / LPF 0-5 (*)    All other components within normal limits  CBC WITH DIFFERENTIAL/PLATELET - Abnormal; Notable for the following:    Hemoglobin 10.7 (*)    HCT 34.0 (*)    All other components within normal limits  WET PREP, GENITAL  PREGNANCY, URINE   Procedures Procedures   Medications Ordered in ED Medications - No data to display  Initial Impression / Assessment and Plan / ED Course  I have reviewed the triage vital signs and the nursing notes.  Pertinent lab results that were available during my care of the patient were reviewed by me and considered in my medical decision making (see chart for details).  Dysfunctional uterine bleeding. Old records are reviewed, and she has visits related to abnormal uterine bleeding going back as far as last June. Hemoglobin is checked and there has been a progressive drop in hemoglobin over the last several months. She refused pelvic exam today. She expresses displeasure not being told I will was wrong, but as far as second oh she is only been seen in emergency departments. She is discharged with prescription for medroxyprogesterone for 5 days to try to bring her bleeding under control and she is also given prescription for ferrous sulfate given dropping hemoglobin. She is referred to women's clinic for further evaluation and treatment.  Final Clinical Impressions(s) / ED Diagnoses   Final diagnoses:  Dysfunctional uterine bleeding  Anemia due to blood loss   New Prescriptions New Prescriptions   FERROUS  SULFATE 325 (65 FE) MG TABLET    Take 1 tablet (325 mg total) by mouth 2 (two) times daily with a meal.   MEDROXYPROGESTERONE (PROVERA) 10 MG TABLET    Take 1 tablet (10 mg total) by mouth daily.   I personally performed the services described in this documentation, which was scribed in my presence. The recorded information has been reviewed and is accurate.      Dione Boozeavid Natasa Stigall, MD 10/16/16 0200

## 2016-10-16 NOTE — Discharge Instructions (Signed)
You will need additional testing by a gynecologist to deterrmine why you are having excessive bleeding, and how to fix it.

## 2016-10-25 ENCOUNTER — Ambulatory Visit (INDEPENDENT_AMBULATORY_CARE_PROVIDER_SITE_OTHER): Payer: Self-pay | Admitting: Obstetrics & Gynecology

## 2016-10-25 ENCOUNTER — Encounter: Payer: Self-pay | Admitting: Obstetrics & Gynecology

## 2016-10-25 VITALS — BP 128/78 | HR 81 | Ht 69.0 in | Wt 237.9 lb

## 2016-10-25 DIAGNOSIS — N939 Abnormal uterine and vaginal bleeding, unspecified: Secondary | ICD-10-CM

## 2016-10-25 DIAGNOSIS — E282 Polycystic ovarian syndrome: Secondary | ICD-10-CM

## 2016-10-25 MED ORDER — NORETHIN ACE-ETH ESTRAD-FE 1-20 MG-MCG(24) PO TABS: 1.0000 | ORAL_TABLET | Freq: Every day | ORAL | 11 refills | Status: AC

## 2016-10-25 MED ORDER — METFORMIN HCL 500 MG PO TABS: ORAL_TABLET | ORAL | 5 refills | Status: AC

## 2016-10-25 NOTE — Progress Notes (Signed)
History:  21 y.o. G0P0000 here today for AUB she reports 'I've been bleeding since June.' Pt reports bleeding 4/7 days in a week and reports 3/7 days of heavy bleeding. She report passing blood clots on her heavy days. She began passing the blood clots in June.  Pt was on OCPs until Aug. Pt was on OCPs for AUB. She was on the OCPs for 2 months. Pt reports bleeding only on the withdrawal period for the OCPs.   PRIOR to OCPs pt was having oligomenorrhea.     Pt had menarche at age 21 year. Pt NEVER had reg monthly cycles. Pt NEVER had reg monthly cycles. Pt was on the Depo Provera for 1 year at age 21- 5117 year. Was on OCPs at age 21 year for 5 months- menses were erg and monthly on OCPs.    Pt reports that she shaves her face 4 days under chin   The following portions of the patient's history were reviewed and updated as appropriate: allergies, current medications, past family history, past medical history, past social history, past surgical history and problem list.  Review of Systems:  Pertinent items are noted in HPI.   Objective:  Physical Exam Blood pressure 128/78, pulse 81, height 5\' 9"  (1.753 m), weight 237 lb 14.4 oz (107.9 kg), last menstrual period 02/02/2016. BP 128/78   Pulse 81   Ht 5\' 9"  (1.753 m)   Wt 237 lb 14.4 oz (107.9 kg)   LMP 02/02/2016 (Within Days)   BMI 35.13 kg/m  CONSTITUTIONAL: Well-developed, well-nourished female in no acute distress.  HENT:  Normocephalic, atraumatic EYES: Conjunctivae and EOM are normal. No scleral icterus.  NECK: Normal range of motion Lungs: CTA CV: RRR SKIN: Skin is warm and dry. No rash noted. Not diaphoretic.No pallor.  Pt has Acanthosis Nigricans on the back of her neck.  Pt has full growth of hair under her chin  NEUROLGIC: Alert and oriented to person, place, and time. Normal coordination.  Abd: obese, soft, nontender and nondistended Pelvic: Normal appearing external genitalia; there is blood noted. Pt refused speculum  exam  Labs and Imaging CBC    Component Value Date/Time   WBC 6.1 10/16/2016 0103   RBC 4.11 10/16/2016 0103   HGB 10.7 (L) 10/16/2016 0103   HCT 34.0 (L) 10/16/2016 0103   PLT 259 10/16/2016 0103   MCV 82.7 10/16/2016 0103   MCH 26.0 10/16/2016 0103   MCHC 31.5 10/16/2016 0103   RDW 14.2 10/16/2016 0103   LYMPHSABS 3.1 10/16/2016 0103   MONOABS 0.6 10/16/2016 0103   EOSABS 0.1 10/16/2016 0103   BASOSABS 0.0 10/16/2016 0103      Assessment & Plan:  AUB- pt meets criteria for PCOS.  She declined speculum exam in the [facility] and here. She does not want hormones due to her desire to get pregnant but, did agree to a short cycle of OCPs to regulate her cycles.  Labs: TSH , HgbA1c today LoEstrin 1po q day for 3 months Metformin 500mg  po q day for 1 week and then bid  Pt to f/u in 3 months or sooner prn Reviewed the long term medical indications assoc with PCOS.  Total face-to-face time with patient was 20 min.  Greater than 50% was spent in counseling and coordination of care with the patient. We discussed- see above     Maryruth Apple L. Harraway-Smith, M.D., Evern CoreFACOG

## 2016-10-25 NOTE — Patient Instructions (Signed)
Polycystic Ovarian Syndrome Polycystic ovarian syndrome (PCOS) is a common hormonal disorder among women of reproductive age. In most women with PCOS, many small fluid-filled sacs (cysts) grow on the ovaries, and the cysts are not part of a normal menstrual cycle. PCOS can cause problems with your menstrual periods and make it difficult to get pregnant. It can also cause an increased risk of miscarriage with pregnancy. If it is not treated, PCOS can lead to serious health problems, such as diabetes and heart disease. What are the causes? The cause of PCOS is not known, but it may be the result of a combination of certain factors, such as:  Irregular menstrual cycle.  High levels of certain hormones (androgens).  Problems with the hormone that helps to control blood sugar (insulin resistance).  Certain genes. What increases the risk? This condition is more likely to develop in women who have a family history of PCOS. What are the signs or symptoms? Symptoms of PCOS may include:  Multiple ovarian cysts.  Infrequent periods or no periods.  Periods that are too frequent or too heavy.  Unpredictable periods.  Inability to get pregnant (infertility) because of not ovulating.  Increased growth of hair on the face, chest, stomach, back, thumbs, thighs, or toes.  Acne or oily skin. Acne may develop during adulthood, and it may not respond to treatment.  Pelvic pain.  Weight gain or obesity.  Patches of thickened and dark brown or black skin on the neck, arms, breasts, or thighs (acanthosis nigricans).  Excess hair growth on the face, chest, abdomen, or upper thighs (hirsutism). How is this diagnosed? This condition is diagnosed based on:  Your medical history.  A physical exam, including a pelvic exam. Your health care provider may look for areas of increased hair growth on your skin.  Tests, such as:  Ultrasound. This may be used to examine the ovaries and the lining of the  uterus (endometrium) for cysts.  Blood tests. These may be used to check levels of sugar (glucose), female hormone (testosterone), and female hormones (estrogen and progesterone) in your blood. How is this treated? There is no cure for PCOS, but treatment can help to manage symptoms and prevent more health problems from developing. Treatment varies depending on:  Your symptoms.  Whether you want to have a baby or whether you need birth control (contraception). Treatment may include nutrition and lifestyle changes along with:  Progesterone hormone to start a menstrual period.  Birth control pills to help you have regular menstrual periods.  Medicines to make you ovulate, if you want to get pregnant.  Medicine to reduce excessive hair growth.  Surgery, in severe cases. This may involve making small holes in one or both of your ovaries. This decreases the amount of testosterone that your body produces. Follow these instructions at home:  Take over-the-counter and prescription medicines only as told by your health care provider.  Follow a healthy meal plan. This can help you reduce the effects of PCOS.  Eat a healthy diet that includes lean proteins, complex carbohydrates, fresh fruits and vegetables, low-fat dairy products, and healthy fats. Make sure to eat enough fiber.  If you are overweight, lose weight as told by your health care provider.  Losing 10% of your body weight may improve symptoms.  Your health care provider can determine how much weight loss is best for you and can help you lose weight safely.  Keep all follow-up visits as told by your health care provider. This  is important. Contact a health care provider if:  Your symptoms do not get better with medicine.  You develop new symptoms. This information is not intended to replace advice given to you by your health care provider. Make sure you discuss any questions you have with your health care provider. Document  Released: 12/14/2004 Document Revised: 04/17/2016 Document Reviewed: 02/05/2016 Elsevier Interactive Patient Education  2017 [employer]  

## 2016-10-26 LAB — TSH: TSH: 3.12 u[IU]/mL (ref 0.450–4.500)

## 2016-10-26 LAB — HEMOGLOBIN A1C
ESTIMATED AVERAGE GLUCOSE: 103
Hgb A1c MFr Bld: 5.2 % (ref 4.8–5.6)

## 2017-07-03 ENCOUNTER — Ambulatory Visit: Payer: Self-pay | Admitting: Obstetrics & Gynecology

## 2017-11-05 ENCOUNTER — Emergency Department (HOSPITAL_BASED_OUTPATIENT_CLINIC_OR_DEPARTMENT_OTHER): Admission: EM | Admit: 2017-11-05 | Discharge: 2017-11-05 | Discharge status: Against Medical Advice | Payer: Self-pay

## 2017-11-05 NOTE — ED Notes (Signed)
No answer when called for room. Registration staff reports they saw pt leave.

## 2017-12-26 ENCOUNTER — Encounter: Payer: Self-pay | Admitting: *Deleted

## 2018-08-27 ENCOUNTER — Encounter (HOSPITAL_BASED_OUTPATIENT_CLINIC_OR_DEPARTMENT_OTHER): Payer: Self-pay | Admitting: Emergency Medicine

## 2018-08-27 ENCOUNTER — Emergency Department (HOSPITAL_BASED_OUTPATIENT_CLINIC_OR_DEPARTMENT_OTHER)
Admission: EM | Admit: 2018-08-27 | Discharge: 2018-08-27 | Disposition: A | Discharge status: Home / Self Care | Payer: BLUE CROSS/BLUE SHIELD | Attending: Emergency Medicine | Admitting: Emergency Medicine

## 2018-08-27 ENCOUNTER — Other Ambulatory Visit: Payer: Self-pay

## 2018-08-27 DIAGNOSIS — Z79899 Other long term (current) drug therapy: Secondary | ICD-10-CM | POA: Diagnosis not present

## 2018-08-27 DIAGNOSIS — D649 Anemia, unspecified: Secondary | ICD-10-CM | POA: Diagnosis not present

## 2018-08-27 DIAGNOSIS — N938 Other specified abnormal uterine and vaginal bleeding: Secondary | ICD-10-CM | POA: Diagnosis not present

## 2018-08-27 DIAGNOSIS — R102 Pelvic and perineal pain: Secondary | ICD-10-CM | POA: Diagnosis present

## 2018-08-27 HISTORY — DX: Gastro-esophageal reflux disease without esophagitis: K21.9

## 2018-08-27 LAB — WET PREP, GENITAL
Clue Cells Wet Prep HPF POC: NONE SEEN
SPERM: NONE SEEN
TRICH WET PREP: NONE SEEN
YEAST WET PREP: NONE SEEN

## 2018-08-27 LAB — CBC WITH DIFFERENTIAL/PLATELET
Abs Immature Granulocytes: 0.01 10*3/uL (ref 0.00–0.07)
BASOS PCT: 0 %
Basophils Absolute: 0 10*3/uL (ref 0.0–0.1)
EOS ABS: 0.1 10*3/uL (ref 0.0–0.5)
Eosinophils Relative: 2 %
HCT: 34.7 % — ABNORMAL LOW (ref 36.0–46.0)
Hemoglobin: 10.2 g/dL — ABNORMAL LOW (ref 12.0–15.0)
Immature Granulocytes: 0 %
Lymphocytes Relative: 52 %
Lymphs Abs: 2.8 10*3/uL (ref 0.7–4.0)
MCH: 23.8 pg — AB (ref 26.0–34.0)
MCHC: 29.4 g/dL — ABNORMAL LOW (ref 30.0–36.0)
MCV: 81.1 fL (ref 80.0–100.0)
MONO ABS: 0.4 10*3/uL (ref 0.1–1.0)
MONOS PCT: 8 %
Neutro Abs: 2.1 10*3/uL (ref 1.7–7.7)
Neutrophils Relative %: 38 %
PLATELETS: 336 10*3/uL (ref 150–400)
RBC: 4.28 MIL/uL (ref 3.87–5.11)
RDW: 15.6 % — AB (ref 11.5–15.5)
WBC: 5.5 10*3/uL (ref 4.0–10.5)
nRBC: 0 % (ref 0.0–0.2)

## 2018-08-27 LAB — PREGNANCY, URINE: Preg Test, Ur: NEGATIVE

## 2018-08-27 NOTE — Discharge Instructions (Signed)
You can continue to take ibuprofen as directed for cramps.  Blood work shows mild anemia .However blood count is unchanged from 1 year ago.  Call the women's outpatient clinic tomorrow to schedule the next available appointment

## 2018-08-27 NOTE — ED Notes (Signed)
ED Provider at bedside. 

## 2018-08-27 NOTE — ED Triage Notes (Signed)
Vaginal bleeding for 1 month.  Has not sought medical care. Today it is "like water".

## 2018-08-27 NOTE — ED Provider Notes (Signed)
[facility] EMERGENCY DEPARTMENT Provider Note   CSN: [id number] Arrival date & time: 08/27/18  0848     History   Chief Complaint Chief Complaint  Patient presents with  . Vaginal Bleeding    HPI Alison Hunter is a 22 y.o. female.  HPI Patient reports "I have been having my period for a month" she associated symptoms include intermittent low abdominal and pelvic cramping.  Cramping last for 30 minutes at a time.  Is relieved with ibuprofen.  No other associated symptoms.  Nothing makes symptoms better or worse.  She presents today as she saw blood clots today.  She denies pain presently.  Last took ibuprofen 4 a.m. today Past Medical History:  Diagnosis Date  . GERD (gastroesophageal reflux disease)     There are no active problems to display for this patient.   History reviewed. No pertinent surgical history.   OB History    Gravida  0   Para  0   Term  0   Preterm  0   AB  0   Living  0     SAB  0   TAB  0   Ectopic  0   Multiple  0   Live Births  0            Home Medications    Prior to Admission medications   Medication Sig Start Date End Date Taking? Authorizing Provider  ferrous sulfate 325 (65 FE) MG tablet Take 1 tablet (325 mg total) by mouth 2 (two) times daily with a meal. 10/16/16   Dione BoozeGlick, David, MD  metFORMIN (GLUCOPHAGE) 500 MG tablet Take one tablet by mouth daily for one week. Then increase to one tablet twice a day for one week.  Then two tablets twice a day. 10/25/16   Willodean RosenthalHarraway-Smith, Carolyn, MD  Norethindrone Acetate-Ethinyl Estrad-FE (LOESTRIN 24 FE) 1-20 MG-MCG(24) tablet Take 1 tablet by mouth daily. 10/25/16   Willodean RosenthalHarraway-Smith, Carolyn, MD  Not currently on birth control pills or metformin  Family History No family history on file.  Social History Social History   Tobacco Use  . Smoking status: Never Smoker  . Smokeless tobacco: Never Used  Substance Use Topics  . Alcohol use: No  . Drug use: No      Allergies   Patient has no known allergies.   Review of Systems Review of Systems  Constitutional: Negative.   HENT: Negative.   Respiratory: Negative.   Cardiovascular: Negative.   Gastrointestinal: Positive for abdominal pain.  Genitourinary: Positive for vaginal bleeding.       Irregular menses for several years  Musculoskeletal: Negative.   Skin: Negative.   Neurological: Negative.   Psychiatric/Behavioral: Negative.   All other systems reviewed and are negative.    Physical Exam Updated Vital Signs BP 132/77 (BP Location: Right Arm)   Pulse 78   Temp 98.2 F (36.8 C) (Oral)   Resp 16   Ht 5\' 9"  (1.753 m)   Wt 104.3 kg   SpO2 100%   BMI 33.97 kg/m   Physical Exam Vitals signs and nursing note reviewed.  Constitutional:      Appearance: She is well-developed.  HENT:     Head: Normocephalic and atraumatic.  Eyes:     Conjunctiva/sclera: Conjunctivae normal.     Pupils: Pupils are equal, round, and reactive to light.  Neck:     Musculoskeletal: Neck supple.     Thyroid: No thyromegaly.     Trachea: No tracheal  deviation.  Cardiovascular:     Rate and Rhythm: Normal rate and regular rhythm.     Heart sounds: No murmur.  Pulmonary:     Effort: Pulmonary effort is normal.     Breath sounds: Normal breath sounds.  Abdominal:     General: Bowel sounds are normal. There is no distension.     Palpations: Abdomen is soft.     Tenderness: There is no abdominal tenderness.     Comments: Obese  Genitourinary:    General: Normal vulva.     Comments: No external lesion.  Dark blood in vaginal vault, slight amount.  Cervical loss closed.  No adnexal masses or tenderness Musculoskeletal: Normal range of motion.        General: No tenderness.  Skin:    General: Skin is warm and dry.     Findings: No rash.  Neurological:     Mental Status: She is alert.     Coordination: Coordination normal.      ED Treatments / Results  Labs (all labs ordered are  listed, but only abnormal results are displayed) Labs Reviewed  WET PREP, GENITAL  RPR  HIV ANTIBODY (ROUTINE TESTING W REFLEX)  CBC WITH DIFFERENTIAL/PLATELET  PREGNANCY, URINE  GC/CHLAMYDIA PROBE AMP ([place]) NOT AT [facility]    EKG None  Radiology No results found.  Procedures Procedures (including critical care time)  Medications Ordered in ED Medications - No data to display  Lab work consistent with mild anemia, unchanged from 1 year ago Results for orders placed or performed during the hospital encounter of 08/27/18  Wet prep, genital  Result Value Ref Range   Yeast Wet Prep HPF POC NONE SEEN NONE SEEN   Trich, Wet Prep NONE SEEN NONE SEEN   Clue Cells Wet Prep HPF POC NONE SEEN NONE SEEN   WBC, Wet Prep HPF POC MODERATE (A) NONE SEEN   Sperm NONE SEEN   CBC with Differential/Platelet  Result Value Ref Range   WBC 5.5 4.0 - 10.5 K/uL   RBC 4.28 3.87 - 5.11 MIL/uL   Hemoglobin 10.2 (L) 12.0 - 15.0 g/dL   HCT 16.1 (L) 09.6 - 04.5 %   MCV 81.1 80.0 - 100.0 fL   MCH 23.8 (L) 26.0 - 34.0 pg   MCHC 29.4 (L) 30.0 - 36.0 g/dL   RDW 40.9 (H) 81.1 - 91.4 %   Platelets 336 150 - 400 K/uL   nRBC 0.0 0.0 - 0.2 %   Neutrophils Relative % 38 %   Neutro Abs 2.1 1.7 - 7.7 K/uL   Lymphocytes Relative 52 %   Lymphs Abs 2.8 0.7 - 4.0 K/uL   Monocytes Relative 8 %   Monocytes Absolute 0.4 0.1 - 1.0 K/uL   Eosinophils Relative 2 %   Eosinophils Absolute 0.1 0.0 - 0.5 K/uL   Basophils Relative 0 %   Basophils Absolute 0.0 0.0 - 0.1 K/uL   Immature Granulocytes 0 %   Abs Immature Granulocytes 0.01 0.00 - 0.07 K/uL  Pregnancy, urine  Result Value Ref Range   Preg Test, Ur NEGATIVE NEGATIVE   No results found. Initial Impression / Assessment and Plan / ED Course  I have reviewed the triage vital signs and the nursing notes.  Pertinent labs & imaging results that were available during my care of the patient were reviewed by me and considered in my medical decision making  (see chart for details).     10:30 AM patient resting comfortably.  Plan  referral women's outpatient clinic.  She can continue ibuprofen for cramps  Final Clinical Impressions(s) / ED Diagnoses  Diagnosis#1 dysfunctional uterine bleeding #2anemia Final diagnoses:  None    ED Discharge Orders    None       Doug SouJacubowitz, Jozalyn Baglio, MD 08/27/18 [phone]

## 2018-08-28 LAB — GC/CHLAMYDIA PROBE AMP (~~LOC~~) NOT AT ARMC
Chlamydia: NEGATIVE
Neisseria Gonorrhea: NEGATIVE

## 2018-08-28 LAB — HIV ANTIBODY (ROUTINE TESTING W REFLEX): HIV SCREEN 4TH GENERATION: NONREACTIVE

## 2018-08-28 LAB — RPR: RPR: NONREACTIVE

## 2018-09-16 ENCOUNTER — Ambulatory Visit: Payer: BLUE CROSS/BLUE SHIELD | Admitting: Obstetrics & Gynecology

## 2019-02-25 ENCOUNTER — Emergency Department (HOSPITAL_BASED_OUTPATIENT_CLINIC_OR_DEPARTMENT_OTHER)
Admission: EM | Admit: 2019-02-25 | Discharge: 2019-02-25 | Disposition: A | Discharge status: Home / Self Care | Payer: BC Managed Care – PPO | Attending: Emergency Medicine | Admitting: Emergency Medicine

## 2019-02-25 ENCOUNTER — Encounter (HOSPITAL_BASED_OUTPATIENT_CLINIC_OR_DEPARTMENT_OTHER): Payer: Self-pay

## 2019-02-25 ENCOUNTER — Other Ambulatory Visit: Payer: Self-pay

## 2019-02-25 DIAGNOSIS — Z7984 Long term (current) use of oral hypoglycemic drugs: Secondary | ICD-10-CM | POA: Insufficient documentation

## 2019-02-25 DIAGNOSIS — M62838 Other muscle spasm: Secondary | ICD-10-CM

## 2019-02-25 DIAGNOSIS — M545 Low back pain: Secondary | ICD-10-CM | POA: Diagnosis present

## 2019-02-25 DIAGNOSIS — Z79899 Other long term (current) drug therapy: Secondary | ICD-10-CM | POA: Insufficient documentation

## 2019-02-25 LAB — PREGNANCY, URINE: Preg Test, Ur: NEGATIVE

## 2019-02-25 MED ORDER — CYCLOBENZAPRINE HCL 10 MG PO TABS: 10.0000 mg | ORAL_TABLET | Freq: Two times a day (BID) | ORAL | 0 refills | Status: AC | PRN

## 2019-02-25 MED FILL — CYCLOBENZAPRINE HCL 10 MG T: 10 | 7 days supply | Qty: 15 | Fill #0
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
## 2019-02-25 NOTE — ED Notes (Signed)
ED Provider at bedside. 

## 2019-03-02 ENCOUNTER — Encounter (HOSPITAL_BASED_OUTPATIENT_CLINIC_OR_DEPARTMENT_OTHER): Payer: Self-pay | Admitting: *Deleted

## 2019-03-02 ENCOUNTER — Emergency Department (HOSPITAL_BASED_OUTPATIENT_CLINIC_OR_DEPARTMENT_OTHER): Payer: BC Managed Care – PPO

## 2019-03-02 ENCOUNTER — Emergency Department (HOSPITAL_BASED_OUTPATIENT_CLINIC_OR_DEPARTMENT_OTHER)
Admission: EM | Admit: 2019-03-02 | Discharge: 2019-03-02 | Disposition: A | Discharge status: Home / Self Care | Payer: BC Managed Care – PPO | Attending: Emergency Medicine | Admitting: Emergency Medicine

## 2019-03-02 ENCOUNTER — Other Ambulatory Visit: Payer: Self-pay

## 2019-03-02 DIAGNOSIS — Y92410 Unspecified street and highway as the place of occurrence of the external cause: Secondary | ICD-10-CM | POA: Diagnosis not present

## 2019-03-02 DIAGNOSIS — Y9389 Activity, other specified: Secondary | ICD-10-CM | POA: Diagnosis not present

## 2019-03-02 DIAGNOSIS — Z7984 Long term (current) use of oral hypoglycemic drugs: Secondary | ICD-10-CM | POA: Insufficient documentation

## 2019-03-02 DIAGNOSIS — S29012A Strain of muscle and tendon of back wall of thorax, initial encounter: Secondary | ICD-10-CM | POA: Insufficient documentation

## 2019-03-02 DIAGNOSIS — Z79899 Other long term (current) drug therapy: Secondary | ICD-10-CM | POA: Diagnosis not present

## 2019-03-02 DIAGNOSIS — S161XXA Strain of muscle, fascia and tendon at neck level, initial encounter: Secondary | ICD-10-CM

## 2019-03-02 DIAGNOSIS — S299XXA Unspecified injury of thorax, initial encounter: Secondary | ICD-10-CM | POA: Diagnosis present

## 2019-03-02 DIAGNOSIS — Y999 Unspecified external cause status: Secondary | ICD-10-CM | POA: Insufficient documentation

## 2019-03-02 MED ORDER — IBUPROFEN 800 MG PO TABS: 800.0000 mg | ORAL_TABLET | Freq: Three times a day (TID) | ORAL | 0 refills | Status: AC | PRN

## 2019-03-02 MED FILL — IBUPROFEN 800 MG TAB: 800 | 7 days supply | Qty: 21 | Fill #0
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
## 2019-03-02 NOTE — Discharge Instructions (Addendum)
Return here as needed.  Your x-rays did not show any abnormalities at this time.  Use ice and heat on your back

## 2019-04-19 ENCOUNTER — Other Ambulatory Visit: Payer: Self-pay

## 2019-04-19 ENCOUNTER — Encounter (HOSPITAL_BASED_OUTPATIENT_CLINIC_OR_DEPARTMENT_OTHER): Payer: Self-pay | Admitting: *Deleted

## 2019-04-19 ENCOUNTER — Emergency Department (HOSPITAL_BASED_OUTPATIENT_CLINIC_OR_DEPARTMENT_OTHER)
Admission: EM | Admit: 2019-04-19 | Discharge: 2019-04-19 | Disposition: A | Discharge status: Home / Self Care | Payer: BC Managed Care – PPO | Attending: Emergency Medicine | Admitting: Emergency Medicine

## 2019-04-19 DIAGNOSIS — O26859 Spotting complicating pregnancy, unspecified trimester: Secondary | ICD-10-CM | POA: Diagnosis not present

## 2019-04-19 DIAGNOSIS — Z3490 Encounter for supervision of normal pregnancy, unspecified, unspecified trimester: Secondary | ICD-10-CM

## 2019-04-19 DIAGNOSIS — Z3A00 Weeks of gestation of pregnancy not specified: Secondary | ICD-10-CM | POA: Insufficient documentation

## 2019-04-19 DIAGNOSIS — N920 Excessive and frequent menstruation with regular cycle: Secondary | ICD-10-CM

## 2019-04-19 LAB — PREGNANCY, URINE: Preg Test, Ur: POSITIVE — AB

## 2019-04-19 NOTE — ED Notes (Addendum)
Pt reports medium amount of vaginal bleeding x2 yesterday. Pt denies abd pain. Pt denies vag discharge. Pt reports taking a pregnancy test at home which was positive. Pt endorses nausea at this time.

## 2019-04-19 NOTE — Discharge Instructions (Signed)
Your pregnancy test today was positive thus we think you are pregnant.  We discussed doing lab work, pelvic exam, ultrasound, and other management today however after our discussion, given your minimal symptoms and the spotting similar to your prior spotting, we agreed that you are likely safe to follow-up with your OB/GYN in the next 24 to 40 hours.  Please start taking prenatal vitamins and follow-up with the OB/GYN.  If you develop any pain, vaginal bleeding, or other symptoms, please return to the nearest emergency department.

## 2019-04-19 NOTE — ED Triage Notes (Signed)
Pt reports she has irregular periods, Last period was end of June. She states she had cramping and spotting last night. Today she had a positive home pregnancy test

## 2019-04-19 NOTE — ED Provider Notes (Signed)
[facility]PART[facility] Provider Note   CSN: [id number] Arrival date & time: 04/19/19  1904     History   Chief Complaint Chief Complaint  Patient presents with  . Vaginal Bleeding    HPI Alison Hunter is a 23 y.o. female.     The history is provided by the patient and medical records. No language interpreter was used.  Vaginal Bleeding Quality:  Spotting Severity:  Mild Onset quality:  Gradual Duration:  1 day Timing:  Rare Progression:  Unchanged Chronicity:  New Menstrual history:  Missed period Possible pregnancy: yes   Relieved by:  Nothing Worsened by:  Nothing Ineffective treatments:  None tried Associated symptoms: no abdominal pain, no back pain, no dizziness, no dysuria, no fatigue, no fever, no nausea and no vaginal discharge     Past Medical History:  Diagnosis Date  . GERD (gastroesophageal reflux disease)     There are no active problems to display for this patient.   History reviewed. No pertinent surgical history.   OB History    Gravida  0   Para  0   Term  0   Preterm  0   AB  0   Living  0     SAB  0   TAB  0   Ectopic  0   Multiple  0   Live Births  0            Home Medications    Prior to Admission medications   Medication Sig Start Date End Date Taking? Authorizing Provider  cyclobenzaprine (FLEXERIL) 10 MG tablet Take 1 tablet (10 mg total) by mouth 2 (two) times daily as needed for up to 15 doses for muscle spasms. 02/25/19   Curatolo, Adam, DO  ferrous sulfate 325 (65 FE) MG tablet Take 1 tablet (325 mg total) by mouth 2 (two) times daily with a meal. 01/18/60   Delora Fuel, MD  ibuprofen (ADVIL) 800 MG tablet Take 1 tablet (800 mg total) by mouth every 8 (eight) hours as needed. 03/02/19   Lawyer, Harrell Gave, PA-C  metFORMIN (GLUCOPHAGE) 500 MG tablet Take one tablet by mouth daily for one week. Then increase to one tablet twice a day for one week.  Then two tablets twice a day. 10/25/16    Lavonia Drafts, MD  Norethindrone Acetate-Ethinyl Estrad-FE (LOESTRIN 24 FE) 1-20 MG-MCG(24) tablet Take 1 tablet by mouth daily. 10/25/16   Lavonia Drafts, MD    Family History No family history on file.  Social History Social History   Tobacco Use  . Smoking status: Never Smoker  . Smokeless tobacco: Never Used  Substance Use Topics  . Alcohol use: No  . Drug use: No     Allergies   Patient has no known allergies.   Review of Systems Review of Systems  Constitutional: Negative for chills, diaphoresis, fatigue and fever.  HENT: Negative for congestion.   Respiratory: Negative for cough, chest tightness, shortness of breath, wheezing and stridor.   Cardiovascular: Negative for chest pain.  Gastrointestinal: Negative for abdominal pain, constipation, diarrhea, nausea and vomiting.  Genitourinary: Positive for vaginal bleeding. Negative for decreased urine volume, dysuria, flank pain, vaginal discharge and vaginal pain.  Musculoskeletal: Negative for back pain.  Neurological: Negative for dizziness.  All other systems reviewed and are negative.    Physical Exam Updated Vital Signs BP (!) 144/81 (BP Location: Left Arm)   Pulse 83   Temp 98.3 F (36.8 C) (Oral)   Resp  18   Ht 5\' 9"  (1.753 m)   Wt 99.8 kg   LMP 03/03/2019 (Approximate)   SpO2 100%   BMI 32.49 kg/m   Physical Exam Vitals signs and nursing note reviewed.  Constitutional:      General: She is not in acute distress.    Appearance: She is well-developed. She is not toxic-appearing or diaphoretic.  HENT:     Head: Normocephalic and atraumatic.     Right Ear: External ear normal.     Left Ear: External ear normal.     Nose: Nose normal. No congestion or rhinorrhea.     Mouth/Throat:     Mouth: Mucous membranes are moist.     Pharynx: No oropharyngeal exudate.  Eyes:     Conjunctiva/sclera: Conjunctivae normal.     Pupils: Pupils are equal, round, and reactive to light.  Neck:      Musculoskeletal: Normal range of motion and neck supple. No muscular tenderness.  Cardiovascular:     Rate and Rhythm: Normal rate.     Pulses: Normal pulses.  Pulmonary:     Effort: No respiratory distress.     Breath sounds: No stridor. No wheezing, rhonchi or rales.  Chest:     Chest wall: No tenderness.  Abdominal:     General: There is no distension.     Tenderness: There is no abdominal tenderness. There is no right CVA tenderness, left CVA tenderness or rebound.  Genitourinary:    Comments: Refused Musculoskeletal:        General: No tenderness.  Skin:    General: Skin is warm.     Capillary Refill: Capillary refill takes less than 2 seconds.     Findings: No erythema or rash.  Neurological:     General: No focal deficit present.     Mental Status: She is alert and oriented to person, place, and time.     Motor: No abnormal muscle tone.     Coordination: Coordination normal.     Deep Tendon Reflexes: Reflexes are normal and symmetric.  Psychiatric:        Mood and Affect: Mood normal.      ED Treatments / Results  Labs (all labs ordered are listed, but only abnormal results are displayed) Labs Reviewed  PREGNANCY, URINE - Abnormal; Notable for the following components:      Result Value   Preg Test, Ur POSITIVE (*)    All other components within normal limits    EKG None  Radiology No results found.  Procedures Procedures (including critical care time)  Medications Ordered in ED Medications - No data to display   Initial Impression / Assessment and Plan / ED Course  I have reviewed the triage vital signs and the nursing notes.  Pertinent labs & imaging results that were available during my care of the patient were reviewed by me and considered in my medical decision making (see chart for details).        Alison Hunter is a 23 y.o. female with a past medical history significant for GERD and irregular menstrual cycles who presents with possible  pregnancy.  Patient reports that she has not had menstrual cycle since the end of June or early July and felt nauseous this week.  She took a pregnancy test and it was positive at home.  She presents to confirm pregnancy test.  She also reports she has has had some vaginal spotting with blood but not a significant on a bleeding.  She  denies any abdominal pain urinary symptoms or GI symptoms.  On exam, lungs are clear and chest is nontender.  Abdomen is nontender.  CVA areas nontender.  Pregnancy test was performed in triage showing that she is pregnant.  Patient was offered further work-up given the vaginal bleeding to check for Rh positivity, labs, ultrasound to confirm IUP, and pelvic exam however patient would rather do all this with her OB/GYN and PCP.  She is not interested in getting any work-up done today other than the pregnancy test at this time.  Patient was advised that if she is having a possible miscarriage or bleeding related to her pregnancy that it would be helpful to know her Rh and the location of the pregnancy however  patient still wants to be discharged.  Patient understands return precautions and was discharged in good condition.          Final Clinical Impressions(s) / ED Diagnoses   Final diagnoses:  Pregnancy, unspecified gestational age  Spotting    ED Discharge Orders    None      . Clinical Impression: 1. Pregnancy, unspecified gestational age   702. Spotting     Disposition: Discharge  Condition: Good  I have discussed the results, Dx and Tx plan with the pt(& family if present). He/she/they expressed understanding and agree(s) with the plan. Discharge instructions discussed at great length. Strict return precautions discussed and pt &/or family have verbalized understanding of the instructions. No further questions at time of discharge.    Discharge Medication List as of 04/19/2019 10:33 PM      Follow Up: Your OBGYN        Tegeler,  Canary Brimhristopher J, MD 04/20/19 [phone]
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
## 2024-06-12 NOTE — Progress Notes (Signed)
 Annual GYN Note  CC:  Annual visit   HPI: The patient is a 28 y.o. G1P1001, No LMP recorded (lmp unknown). Patient has had an injection., here for annual visit.    Gyn complaints: pt states she is on depo and has been bleeding with clots in between injections. Recently bleed about a week and a half ago, next injection isn't until 06/30/2024.     The patient is sexually active.     Immunizations by Immunization Family     Covid-19 Vaccine Unspecified 04/04/2020 (28 y.o.) 05/02/2020 (28 y.o.)     DTP 05/16/1995 (2 m.o.) 09/02/1995 (6 m.o.)     DTaP vaccine(INFANRIX)6W-6Y 07/18/1995 (4 m.o.) 06/15/1996 (15 m.o.) 06/28/1999 (28 y.o.)    Hep B, Unspecified 1995/05/15 (0 days) 05/16/1995 (2 m.o.) 09/02/1995 (6 m.o.)    HiB, Unspecified 05/16/1995 (2 m.o.) 07/18/1995 (4 m.o.) 09/02/1995 (6 m.o.) 04/29/1996 (14 m.o.)   IPV 07/18/1995 (4 m.o.)      Influenza, Injectable, Quadrivalent, Preservative Free 08/30/2022 (28 y.o.)      MMR 04/29/1996 (14 m.o.) 06/28/1999 (28 y.o.)     OPV 05/16/1995 (2 m.o.) 04/29/1996 (14 m.o.)     Polio, Unspecified 06/28/1999 (28 y.o.)      TDAP VACCINE (BOOSTRIX,ADACEL) 7Y+ 05/31/2007 (28 y.o.) 12/11/2019 (28 y.o.)     Varicella SQ (VARIVAX) 1Y+ 04/29/1996 (14 m.o.) 04/08/2006 (28 y.o.)         ROS negative except as above.  ObHx: OB History  Gravida Para Term Preterm AB Living  1 1 1   1   SAB IAB Ectopic Molar Multiple Live Births       1    # Outcome Date GA Lbr Len/2nd Weight Sex Type Anes PTL Lv  1 Term 12/10/19 [redacted]w[redacted]d / 03:28 3.165 kg (6 lb 15.6 oz) M Vag-Spont EPI N LIV     PMH:  Medical History[1]  PSH:  Surgical History[2]  FamH:  Family History[3]   SH:  Alcohol Screening: Not At Risk (05/30/2024)   Alcohol    Audit C Alcohol risk score: 1    Meds:    Medications Ordered Prior to Encounter[4]  All:  Allergies[5]   Physical Exam: Ht 1.753 m (5' 9)   Wt 111 kg (245 lb)   LMP  (LMP Unknown)   BMI 36.18 kg/m  General Appearance:  Well  developed, well nourished, no acute distress Psychiatric:  Normal mood and affect, appropriate; alert and oriented to person, place, and time Skin: No rashes or lesion; no induration or nodules noted Lymphatics: No axillary, or inguinal adenopathy noted  Cardiovascular:  Extremities grossly normal, nontender with no edema; Pulmonary: Good air movement with normal work of breathing.  Abdomen: Soft, nontender, nondistended. No masses or hernias appreciated. No rebound or guarding.   Breast:    Inspection: no asymmetry, skin changes, or nipple discharge/blood Palpation: no masses palpated, no tenderness  Pelvic:        External Genitalia: Normal female genitalia w/o masses, lesions, rashes    Urethral Meatus: Normal caliber and position    Urethra: Midline, no masses    Bladder: Well-suspended, NT    Vagina: mucosa pink and moist without lesions or ulcers     Cervix: No lesions, normal size and consistency; no cervical motion tenderness     Uterus: Normal size and contour; smooth, mobile, NT Adnexa/Parametria: No masses; no parametrial nodularity; no tenderness Perineum/Anus: No lesions, no hemorrhoids        Assessment/Plan:  27 y.o. G1P1001 here for annual  visit. Problem List Items Addressed This Visit   None    1.  Health maintenance: Counseling and/or handouts provided  Cervical cancer screening: Reviewed ASCCP guidelines for screening.  up to date - 04/2023 neg - hx of abnl with colpo in 2021  Patient  has not received Gardisil vaccine - starting today  Mammogram: Discussed ACOG recommendation for women at average risk, including risks and benefits associated with screening.  We discussed screening starting at age 24 years, but no later than age 2 years.  We discussed screening mammography every 1-2 years until at least age 81. Reviewed breast self awareness Not indicated  Patient does not have a family history of breast cancer.  Colorectal screening: Starting at age 35,  colonoscopy every 10 years or as instructed based on findings  Not indicated  DEXA: Reviewed screening recommendations for normal women (age 13) or those at increased risk such as h/o compression fx, fragility fxs, glucocorticoid tx > 3 mo, malabsorption syndrome, primary PTH, propensity to fall, osteopenia, hypogonadism, menopause prior to age 40, RA, chronic anticonvulsant therapy, smoking, EtOH abuse, excessive caffeine intake, chronic heparin use, wt < 57 kg.   Not indicated   2.  Counseling: Counseling and/or handouts provided regarding:    weight control Pt encouraged to walk 3x/week at a vigorous pace to help control weight, maintain mobility, and improve cardiovascular control.  Also encouraged portion/calorie control and avoidance of empty calories found in juice, soft drinks, and fast food preparations.   3.  Contraception and STI screening Refilled none - depo, see below STI screening:  pan screening ordered.    4. Breakthrough bleeding with Depo Provera  5. Contraception counseling 6. AUB Patient is having heavy breakthrough bleeding with use of Depo-Provera  This occurs towards the end of the shot but also throughout the month We discussed in detail options for management.  After discussion of all options patient would like to proceed with IUD placement Will also plan for labs today for TSH, CBC, anemia profile as heavy bleeding is a bit unusual to be caused by Depo Provera  Will plan to place ultrasound guided IUD.  Patient signed consent today.  Valium sent for procedure.  Reviewed need for driver.    No orders of the defined types were placed in this encounter.    Requested Prescriptions    No prescriptions requested or ordered in this encounter     No follow-ups on file.     This document serves as a record of services personally performed by Lavanda Rumalda Glaze, MD.  It was created on their behalf by Richerd Panda, CMA, a trained medical scribe, and Certified  Medical Assistant (CMA). During the course of documenting the history, physical exam and medical decision making, I was functioning as a stage manager. The creation of this record is the providers dictation and/or activities during the visit.  Electronically signed by Richerd Panda, CMA 06/12/2024 8:56 AM In addition to components of annual exam, medical decision making moderate as 1 new undiagnosed problem (AUB) with ordering of each unique test and prescription drug management       [1] Past Medical History: Diagnosis Date   Abnormal Pap smear of cervix    Gastroesophageal reflux disease without esophagitis    History of gestational diabetes    Migraine without aura    Obesity due to excess calories with serious comorbidity    PCOS (polycystic ovarian syndrome)    Vitamin D deficiency   [2] Past Surgical History: Procedure Laterality  Date   COLPOSCOPY  03/2020   normal  [3] Family History Problem Relation Name Age of Onset   Hypertension Maternal Grandmother     No Known Problems Father     No Known Problems Mother     Breast cancer Cousin     Cancer Neg Hx     Colon cancer Neg Hx     Ovarian cancer Neg Hx    [4] Current Outpatient Medications on File Prior to Visit  Medication Sig Dispense Refill   Accu-Chek Guide Glucose Meter misc USE AS DIRECTED to check blood sugar at least once a day. (Patient not taking: Reported on 04/23/2023) 1 each 0   accu-chek safe-t pro (Accu-Chek Safe-T-Pro Plus) 23 gauge misc 1 each by miscellaneous route 4 (four) times a day. (Patient not taking: Reported on 04/23/2023) 200 each 11   glucose blood (Precision Q-I-D Test) test strip 1 each by miscellaneous route Once Daily. (Patient not taking: Reported on 04/23/2023) 100 each 5   norethindrone-e.estradioL-iron (Junel FE 1.5/30, 28,) 1.5 mg-30 mcg (21)/75 mg (7) tab Take 1 tablet by mouth daily. (Patient not taking: Reported on 04/23/2023) 28 tablet 0   omeprazole   (PriLOSEC) 20 mg DR capsule Take 1 capsule (20 mg total) by mouth in the morning and 1 capsule (20 mg total) at noon. Indications: gastroesophageal reflux disease. 60 capsule 1   phenazopyridine (PYRIDIUM) 200 mg tablet Take 1 tablet (200 mg total) by mouth 3 (three) times a day as needed for bladder spasms. (Patient not taking: Reported on 04/23/2023) 10 tablet 0   phentermine 30 mg cap Take 1 capsule by mouth every morning.     triamcinolone (KENALOG) 0.1 % cream Apply to effective areas, 2 times a day as needed. 30 g 2   No current facility-administered medications on file prior to visit.  [5] No Known Allergies

## 2024-09-18 NOTE — Progress Notes (Signed)
 Depo provera  150 mg given IM in R Deltoid without complications  Next depo due 12/04/24-12/18/2024 Ce due 06/13/2025 [facility] [place] DELENA Sprung, CMA
